# Patient Record
Sex: Male | Born: 1990 | Race: White | Hispanic: No | Marital: Married | Smoking: Never smoker
Health system: Southern US, Community
[De-identification: ages and names within clinical notes are randomized; demographics above are authoritative.]

## PROBLEM LIST (undated history)

## (undated) DIAGNOSIS — U071 COVID-19: Secondary | ICD-10-CM

## (undated) DIAGNOSIS — N200 Calculus of kidney: Secondary | ICD-10-CM

## (undated) HISTORY — DX: Calculus of kidney: N20.0

## (undated) HISTORY — DX: COVID-19: U07.1

---

## 2017-10-26 ENCOUNTER — Ambulatory Visit: Payer: Self-pay

## 2017-10-26 ENCOUNTER — Ambulatory Visit: Payer: Self-pay | Admitting: Sports Medicine

## 2017-10-26 ENCOUNTER — Encounter: Payer: Self-pay | Admitting: Sports Medicine

## 2017-10-26 ENCOUNTER — Ambulatory Visit (INDEPENDENT_AMBULATORY_CARE_PROVIDER_SITE_OTHER): Payer: BLUE CROSS/BLUE SHIELD | Admitting: Sports Medicine

## 2017-10-26 VITALS — BP 120/84 | HR 84 | Ht 71.0 in | Wt 189.0 lb

## 2017-10-26 DIAGNOSIS — G8929 Other chronic pain: Secondary | ICD-10-CM | POA: Diagnosis not present

## 2017-10-26 DIAGNOSIS — M25561 Pain in right knee: Secondary | ICD-10-CM

## 2017-10-26 MED ORDER — IBUPROFEN-FAMOTIDINE 800-26.6 MG PO TABS: 1.0000 | ORAL_TABLET | Freq: Three times a day (TID) | ORAL | 2 refills | Status: DC | PRN

## 2017-10-26 MED ORDER — IBUPROFEN-FAMOTIDINE 800-26.6 MG PO TABS: 1.0000 | ORAL_TABLET | Freq: Three times a day (TID) | ORAL | 0 refills | Status: DC | PRN

## 2017-10-26 NOTE — Progress Notes (Signed)
Veverly FellsMichael D. Delorise Shinerigby, DO   Sports Medicine [facility] [phone]  Daniel Davila - 26 y.o. male MRN [id number]  Date of birth: [birth year]  Visit Date: 10/26/2017  PCP: Patient, No Pcp Per   Referred by: No ref. provider found   Scribe for today's visit: Christoper FabianMolly Weber, ATC    SUBJECTIVE:  Daniel CarinaJess Davila is here for New Patient (Initial Visit) (R knee pain) .   His R post knee pain symptoms INITIALLY: Began a few years ago and has gotten worse over the past 2-3 months.  He notes that he first started having pain [facility] [place] Described as 3/10 pain at rest and a sharp 6-7/10 at it's worst, radiating to both the R thigh and R lower leg Worsened with driving Improved with ice Additional associated symptoms include: no N/T in the R LE.    At this time symptoms are worsening compared to onset. He has tried ice which gives limited relief.   ROS Denies night time disturbances. Denies fevers, chills, or night sweats. Denies unexplained weight loss. Denies personal history of cancer. Denies changes in bowel or bladder habits. Denies recent unreported falls. Denies new or worsening dyspnea or wheezing. Denies headaches or dizziness.  Denies numbness, tingling or weakness  In the extremities.  Denies dizziness or presyncopal episodes Denies lower extremity edema     HISTORY & PERTINENT PRIOR DATA:  Prior History reviewed and updated per electronic medical record.  Significant history, findings, studies and interim changes include:  reports that  has never smoked. he has never used smokeless tobacco. No results for input(s): HGBA1C, LABURIC, CREATINE in the last 8760 hours. No specialty comments available. No problems updated.   OBJECTIVE:  VS:  HT:5\' 11"  (180.3 cm)   WT:189 lb (85.7 kg)  BMI:26.37    BP:120/84  HR:84bpm  TEMP: ( )  RESP:97 %   PHYSICAL EXAM: Constitutional: WDWN, Non-toxic  appearing. Psychiatric: Alert & appropriately interactive. Not depressed or anxious appearing. Respiratory: No increased work of breathing. Trachea Midline Eyes: Pupils are equal. EOM intact without nystagmus. No scleral icterus Cardiovascular:  Peripheral Pulses: peripheral pulses symmetrical No clubbing or cyanosis appreciated Capillary Refill is normal, less than 2 seconds No signficant generalized edema/anasarca Sensory Exam: intact to light touch   Right Knee Exam: No significant rashes/lesions/ulcerations overlying the examined area No overlying erythema/ecchymosis. Alignment & Contours: normal Gait: normal Effusion: None   Generalized Synovitis: no  Tenderness to palpation over: medial joint line, otherwise No focal TTP  ROM: No significant limitations Strength: No significant weakness, extensor mechanism intact Hip Abductor Strength: 4/5 Recruitment pattern: TFL predominant Ligamentous testing: Small amount of pain with valgus stressing Stable to varus/valgus strain Stable to anterior/posterior drawer Solid Lachman's Mcmurray's: Negative Thessaly: Negative No significant Baker's cyst  No additional findings.   ASSESSMENT & PLAN:   1. Chronic pain of right knee    PLAN: Degenerative meniscal changes appreciated on MSK ultrasound. Therapeutic exercises working on quad strengthening and hip strengthening emphasized with the patient today.  Avoidance of exacerbating activities reviewed.  Follow-up with any lack of improvement.  No problem-specific Assessment & Plan notes found for this encounter.   ++++++++++++++++++++++++++++++++++++++++++++ Orders & Meds: Orders Placed This Encounter  Procedures  . US LIMITED JOINT SPACE STRUCTURES LOW RIGHT(NO LINKED CHARGES)    Meds ordered this encounter  Medications  . Ibuprofen-Famotidine (DUEXIS) 800-26.6 MG TABS    Sig: Take 1 tablet by mouth 3 (  three) times daily as needed. 1 tab po tid X 14 days then 1 tab po tid as  needed    Dispense:  90 tablet    Refill:  2    Home Phone      [phone] Mobile          [phone]   . Ibuprofen-Famotidine (DUEXIS) 800-26.6 MG TABS    Sig: Take 1 tablet by mouth 3 (three) times daily as needed.    Dispense:  9 tablet    Refill:  0    ++++++++++++++++++++++++++++++++++++++++++++ Follow-up: Return if symptoms worsen or fail to improve.   Pertinent documentation may be included in additional procedure notes, imaging studies, problem based documentation and patient instructions. Please see these sections of the encounter for additional information regarding this visit. CMA/ATC served as Neurosurgeonscribe during this visit. History, Physical, and Plan performed by medical provider. Documentation and orders reviewed and attested to.      Andrena MewsMichael D Karmela Bram, DO    [facility] Physician

## 2017-11-06 ENCOUNTER — Encounter: Payer: Self-pay | Admitting: Sports Medicine

## 2017-11-06 NOTE — Procedures (Signed)
LIMITED MSK ULTRASOUND OF right knee Images were obtained and interpreted by myself, Gaspar BiddingMichael Rigby, DO  Images have been saved and stored to PACS system. Images obtained on: GE S7 Ultrasound machine  FINDINGS:  Patella & Patellar Tendon: Normal Quad & Quad Tendon: Normal Suprapatellar Pouch: Normal, no effusion Medial Joint Line: Degenerative bulging medial meniscus with no overt tearing although incompletely visualized, small amount of fluid consistent with a small para meniscal cyst tracking distally but this is mild. Lateral Joint Line: Normal lateral meniscus Trochlea: Normal appearing cartilage Posterior knee: No appreciable Baker's cyst  IMPRESSION:  1. Degenerative medial meniscus but no overt tearing, slight extrusion 2. Likely small amount of patellofemoral pain syndrome 3. Possible remote MCL injury

## 2019-07-31 ENCOUNTER — Other Ambulatory Visit: Payer: Self-pay

## 2019-07-31 DIAGNOSIS — Z20822 Contact with and (suspected) exposure to covid-19: Secondary | ICD-10-CM

## 2019-08-02 LAB — NOVEL CORONAVIRUS, NAA: SARS-CoV-2, NAA: NOT DETECTED

## 2019-11-24 ENCOUNTER — Encounter (HOSPITAL_COMMUNITY): Payer: Self-pay

## 2019-11-24 ENCOUNTER — Emergency Department (HOSPITAL_COMMUNITY)
Admission: EM | Admit: 2019-11-24 | Discharge: 2019-11-24 | Disposition: A | Discharge status: Home / Self Care | Payer: 59 | Attending: Emergency Medicine | Admitting: Emergency Medicine

## 2019-11-24 ENCOUNTER — Other Ambulatory Visit: Payer: Self-pay

## 2019-11-24 ENCOUNTER — Emergency Department (HOSPITAL_COMMUNITY): Payer: 59

## 2019-11-24 DIAGNOSIS — R109 Unspecified abdominal pain: Secondary | ICD-10-CM

## 2019-11-24 DIAGNOSIS — N201 Calculus of ureter: Secondary | ICD-10-CM | POA: Diagnosis not present

## 2019-11-24 LAB — URINALYSIS, ROUTINE W REFLEX MICROSCOPIC
Bilirubin Urine: NEGATIVE
Glucose, UA: NEGATIVE mg/dL
Ketones, ur: NEGATIVE mg/dL
Leukocytes,Ua: NEGATIVE
Nitrite: NEGATIVE
Protein, ur: 100 mg/dL — AB
RBC / HPF: >50 RBC/hpf — ABNORMAL HIGH (ref 0–5)
Specific Gravity, Urine: 1.033 — ABNORMAL HIGH (ref 1.005–1.030)
pH: 6 (ref 5.0–8.0)

## 2019-11-24 MED ORDER — HYDROCODONE-ACETAMINOPHEN 5-325 MG PO TABS
1.0000 | ORAL_TABLET | Freq: Once | ORAL | Status: AC
  Administered 2019-11-24: 1 via ORAL
  Filled 2019-11-24: qty 1

## 2019-11-24 MED ORDER — ONDANSETRON HCL 4 MG/2ML IJ SOLN
4.0000 mg | Freq: Once | INTRAMUSCULAR | Status: AC
  Administered 2019-11-24: 4 mg via INTRAVENOUS
  Filled 2019-11-24: qty 2

## 2019-11-24 MED ORDER — HYDROMORPHONE HCL 1 MG/ML IJ SOLN
0.5000 mg | Freq: Once | INTRAMUSCULAR | Status: AC
  Administered 2019-11-24: 0.5 mg via INTRAVENOUS
  Filled 2019-11-24: qty 1

## 2019-11-24 MED ORDER — ONDANSETRON 4 MG PO TBDP: 4.0000 mg | ORAL_TABLET | Freq: Three times a day (TID) | ORAL | 0 refills | Status: DC | PRN

## 2019-11-24 MED ORDER — HYDROCODONE-ACETAMINOPHEN 5-325 MG PO TABS: 1.0000 | ORAL_TABLET | ORAL | 0 refills | Status: DC | PRN

## 2019-11-24 MED ORDER — HYDROMORPHONE HCL 1 MG/ML IJ SOLN
1.0000 mg | Freq: Once | INTRAMUSCULAR | Status: AC
  Administered 2019-11-24: 1 mg via INTRAVENOUS
  Filled 2019-11-24: qty 1

## 2019-11-24 MED ORDER — IBUPROFEN 800 MG PO TABS: 800.0000 mg | ORAL_TABLET | Freq: Three times a day (TID) | ORAL | 0 refills | Status: DC

## 2019-11-24 NOTE — Discharge Instructions (Addendum)
Please go to your urology appointment as scheduled on 11/27/2019.  I encourage you to get established with a primary care provider as soon as possible for ongoing evaluation management of your health and wellbeing.  Please take your medications as prescribed.  Please take the ibuprofen for your discomfort and the Vicodin only for breakthrough pain.  Return to the ED or seek medical attention immediately should you develop any uncontrolled nausea and vomiting, pain uncontrolled with medication, fevers or chills, inability to urinate, or any other new or worsening symptoms.  You were given narcotic and or sedative medications while in the emergency department. Do not drive. Do not use machinery or power tools. Do not sign legal documents. Do not drink alcohol. Do not take sleeping pills. Do not supervise children by yourself. Do not participate in activities that require climbing or being in high places.

## 2019-11-24 NOTE — ED Provider Notes (Signed)
[facility] DEPT Provider Note   CSN: [id number] Arrival date & time: 11/24/19  0456     History Chief Complaint  Patient presents with  . Hematuria  . Flank Pain    [name] is a 29 y.o. male.  Patient to ED with onset right flank pain and vomiting tonight. No fever. He reports history of kidney stone x 1 two years ago that did not require intervention. He reports hematuria. No CP, SOB, cough.   The history is provided by the patient. No language interpreter was used.  Hematuria  Flank Pain       History reviewed. No pertinent past medical history.  There are no problems to display for this patient.   History reviewed. No pertinent surgical history.     History reviewed. No pertinent family history.  Social History   Tobacco Use  . Smoking status: Never Smoker  . Smokeless tobacco: Never Used  Substance Use Topics  . Alcohol use: Not on file  . Drug use: Not on file    Home Medications Prior to Admission medications   Medication Sig Start Date End Date Taking? Authorizing Provider  Ibuprofen-Famotidine (DUEXIS) 800-26.6 MG TABS Take 1 tablet by mouth 3 (three) times daily as needed. 1 tab po tid X 14 days then 1 tab po tid as needed 10/26/17   Gerda Diss, DO  Ibuprofen-Famotidine (DUEXIS) 800-26.6 MG TABS Take 1 tablet by mouth 3 (three) times daily as needed. 10/26/17   Gerda Diss, DO    Allergies    Patient has no known allergies.  Review of Systems   Review of Systems  Constitutional: Negative for chills and fever.  Respiratory: Negative.   Cardiovascular: Negative.   Gastrointestinal: Positive for nausea and vomiting.  Genitourinary: Positive for flank pain and hematuria.  Skin: Negative.   Neurological: Negative.     Physical Exam Updated Vital Signs BP (!) 151/79 (BP Location: Left Arm)   Pulse 75   Temp 98.6 F (37 C) (Oral)   Resp 18   Ht 6' (1.829 m)   Wt 88.5 kg   SpO2 96%   BMI  26.45 kg/m   Physical Exam Vitals and nursing note reviewed.  Constitutional:      Appearance: He is well-developed.     Comments: Uncomfortable appearing, actively vomiting.  Cardiovascular:     Rate and Rhythm: Normal rate.  Pulmonary:     Effort: Pulmonary effort is normal.  Abdominal:     Tenderness: There is abdominal tenderness (Right flank to RLQ abdominal tenderness).  Musculoskeletal:        General: Normal range of motion.     Cervical back: Normal range of motion.  Skin:    General: Skin is warm and dry.  Neurological:     Mental Status: He is alert and oriented to person, place, and time.     ED Results / Procedures / Treatments   Labs (all labs ordered are listed, but only abnormal results are displayed) Labs Reviewed  URINALYSIS, ROUTINE W REFLEX MICROSCOPIC    EKG None  Radiology No results found.  Procedures Procedures (including critical care time)  Medications Ordered in ED Medications  HYDROmorphone (DILAUDID) injection 1 mg (has no administration in time range)  ondansetron (ZOFRAN) injection 4 mg (has no administration in time range)    ED Course  I have reviewed the triage vital signs and the nursing notes.  Pertinent labs & imaging results that were available during  my care of the patient were reviewed by me and considered in my medical decision making (see chart for details).    MDM Rules/Calculators/A&P                      Patient with h/o kidney stone 2 years ago presents with right flank pain and hematuria starting tonight. No fever.   Likely recurrent stone in right ureter. UA and CT renal pending. Re-eval after 1 mg dilaudid and Zofran: his nausea is resolved, pain is present but better. Dilaudid 0.5 mg ordered.   Patient care signed out to Evelena Leyden, PA-C, pending CT and UA results.  Final Clinical Impression(s) / ED Diagnoses Final diagnoses:  None   1. Renal colic  Rx / DC Orders ED Discharge Orders    None        Elpidio Anis, PA-C 11/24/19 [id number]    Devoria Albe, MD 11/24/19 [phone]

## 2019-11-24 NOTE — ED Triage Notes (Signed)
Pt reports flank pain, hematuria and N/V. Vomiting in triage.

## 2019-11-24 NOTE — ED Provider Notes (Signed)
Received patient as a handoff from Elpidio Anis, PA-C at shift change.  HPI as obtained by handoff provider:  [name] is a 29 y.o. male.  Patient to ED with onset right flank pain and vomiting tonight. No fever. He reports history of kidney stone x 1 two years ago that did not require intervention. He reports hematuria. No CP, SOB, cough.    Physical Exam  BP 128/78 (BP Location: Left Arm)   Pulse (!) 55   Temp 98.6 F (37 C) (Oral)   Resp 16   Ht 6' (1.829 m)   Wt 88.5 kg   SpO2 100%   BMI 26.45 kg/m   Physical Exam Vitals and nursing note reviewed. Exam conducted with a chaperone present.  Constitutional:      Appearance: Normal appearance.     Comments: Mild discomfort.  HENT:     Head: Normocephalic and atraumatic.  Eyes:     General: No scleral icterus.    Conjunctiva/sclera: Conjunctivae normal.  Cardiovascular:     Rate and Rhythm: Normal rate and regular rhythm.  Pulmonary:     Effort: Pulmonary effort is normal.     Breath sounds: Normal breath sounds.  Abdominal:     Comments: Right-sided flank and RLQ TTP.  No overlying skin changes.  No TTP elsewhere.  No guarding.  No masses appreciated.  Musculoskeletal:     Cervical back: Normal range of motion. No rigidity.  Skin:    General: Skin is dry.     Capillary Refill: Capillary refill takes less than 2 seconds.  Neurological:     Mental Status: He is alert and oriented to person, place, and time.     GCS: GCS eye subscore is 4. GCS verbal subscore is 5. GCS motor subscore is 6.  Psychiatric:        Mood and Affect: Mood normal.        Behavior: Behavior normal.        Thought Content: Thought content normal.      ED Course/Procedures     Procedures Results for orders placed or performed during the hospital encounter of 11/24/19  Urinalysis, Routine w reflex microscopic- may I&O cath if menses  Result Value Ref Range   Color, Urine AMBER (A) YELLOW   APPearance CLOUDY (A) CLEAR   Specific  Gravity, Urine 1.033 (H) 1.005 - 1.030   pH 6.0 5.0 - 8.0   Glucose, UA NEGATIVE NEGATIVE mg/dL   Hgb urine dipstick LARGE (A) NEGATIVE   Bilirubin Urine NEGATIVE NEGATIVE   Ketones, ur NEGATIVE NEGATIVE mg/dL   Protein, ur 220 (A) NEGATIVE mg/dL   Nitrite NEGATIVE NEGATIVE   Leukocytes,Ua NEGATIVE NEGATIVE   RBC / HPF >50 (H) 0 - 5 RBC/hpf   Bacteria, UA RARE (A) NONE SEEN   Mucus PRESENT    Hyaline Casts, UA PRESENT    Ca Oxalate Crys, UA PRESENT    CT Renal Stone Study  Result Date: 11/24/2019 CLINICAL DATA:  Right flank pain into right groin. Gross hematuria. Hx of kidney stones. Flank pain, kidney stone suspected EXAM: CT ABDOMEN AND PELVIS WITHOUT CONTRAST TECHNIQUE: Multidetector CT imaging of the abdomen and pelvis was performed following the standard protocol without IV contrast. COMPARISON:  None. FINDINGS: Lower chest: Lung bases are clear. Small 3 mm nodule in the RIGHT lower lobe (image 7/6) Hepatobiliary: No focal hepatic lesion on noncontrast exam. Normal gallbladder. Pancreas: Pancreas is normal. No ductal dilatation. No pancreatic inflammation. Spleen: Normal spleen Adrenals/urinary tract:  Adrenal glands normal. Perinephric stranding about the RIGHT kidney consistent with renal edema. There is periureteral stranding about the proximal RIGHT ureter. There is obstructing calculus in the mid RIGHT ureter measuring 4 mm on image 52/2. This mid RIGHT ureteral calculus is at the L4 vertebral body level. No additional calculi within LEFT or RIGHT kidney. No bladder calculi. Stomach/Bowel: Stomach, small bowel, appendix, and cecum are normal. The colon and rectosigmoid colon are normal. Vascular/Lymphatic: Abdominal aorta is normal caliber. No periportal or retroperitoneal adenopathy. No pelvic adenopathy. Reproductive: Prostate normal. Other: No free fluid. Musculoskeletal: No aggressive osseous lesion IMPRESSION: 1. Obstructing calculus in the mid RIGHT ureter. 2. No nephrolithiasis.  Electronically Signed   By: Suzy Bouchard M.D.   On: 11/24/2019 07:31     MDM   Patient is resting comfortably my exam.  He is recently moved here from [place], [place] and has not yet established with a primary care provider.  He reports that he has a family history of kidney stones and that this is his second one.  He reports that his significant right-sided flank and RLQ pain began around 4:30 AM and has been persistent, however it is well controlled with his Dilaudid and Zofran.  Patient denies any fevers or chills and his UA demonstrated no evidence of infection.  Given that the obstructing stone is only 4 mm, do not feel as though Flomax is necessary.  We will discharge home with Zofran ODT for his nausea symptoms as well as ibuprofen 800 mg 3 times daily.  He will also be prescribed a short course of Vicodin for breakthrough pain.  Patient reports that he already has an appointment with urology scheduled for Wednesday, 11/27/2019 given his frank hematuria.  His wife will pick up from the ER and he will continue his pain control at home.  Return precautions discussed. All of the evaluation and work-up results were discussed with the patient and any family at bedside. They were provided opportunity to ask any additional questions and have none at this time. They have expressed understanding of verbal discharge instructions as well as return precautions and are agreeable to the plan.         Corena Herter, PA-C 11/24/19 [id number]    Veryl Speak, MD 11/24/19 1446

## 2020-12-09 IMAGING — CT CT RENAL STONE PROTOCOL
2 of 4 series · 17 of 46 positions shown, 19 images · non-contrast
Comparison: None.

CLINICAL DATA: Right flank pain into right groin. Gross hematuria.
Hx of kidney stones. Flank pain, kidney stone suspected

EXAM:
CT ABDOMEN AND PELVIS WITHOUT CONTRAST
TECHNIQUE: Multidetector CT imaging of the abdomen and pelvis was performed
following the standard protocol without IV contrast.

[Series 2: axial st · axial · 0.77mm/px · z∈[-497,-47]mm · 14 of 103 slices shown, 16 images]
[im 7/103  soft-tissue]
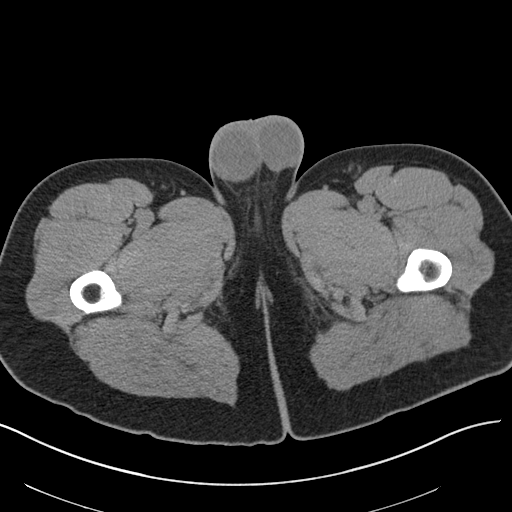
[im 7/103  bone]
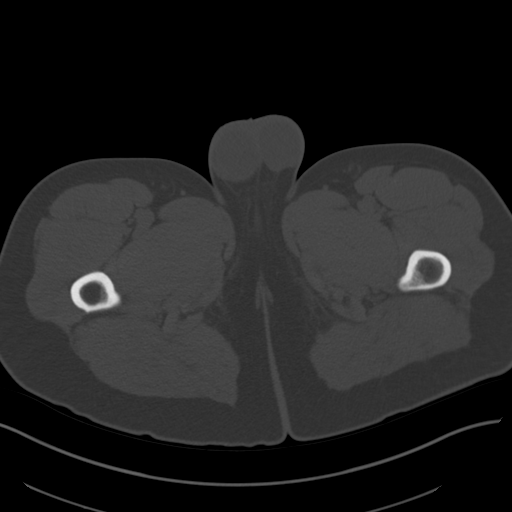
[im 13/103  soft-tissue]
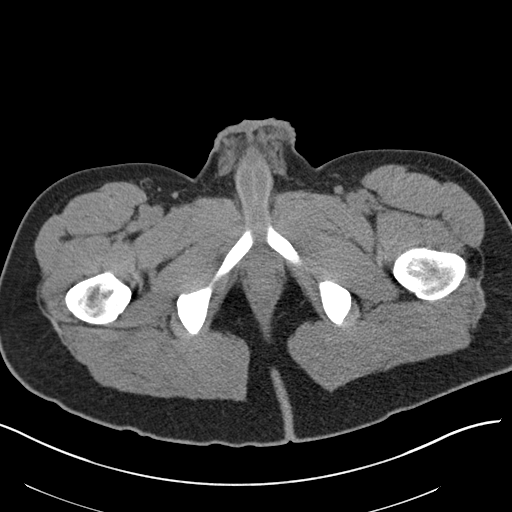
[im 19/103  soft-tissue]
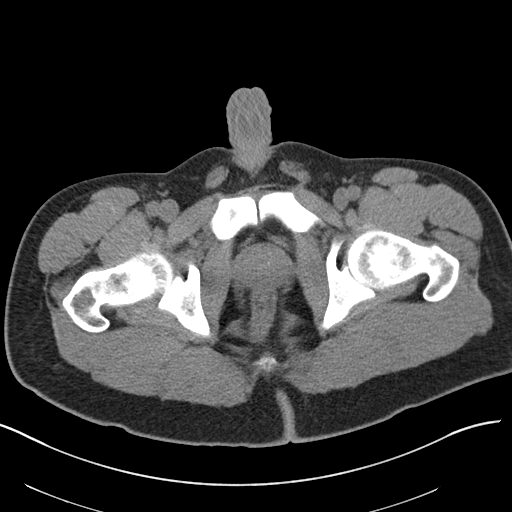
[im 31/103  soft-tissue]
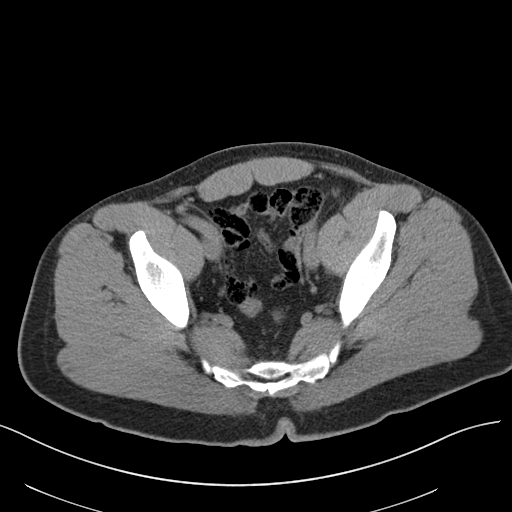
[im 37/103  soft-tissue]
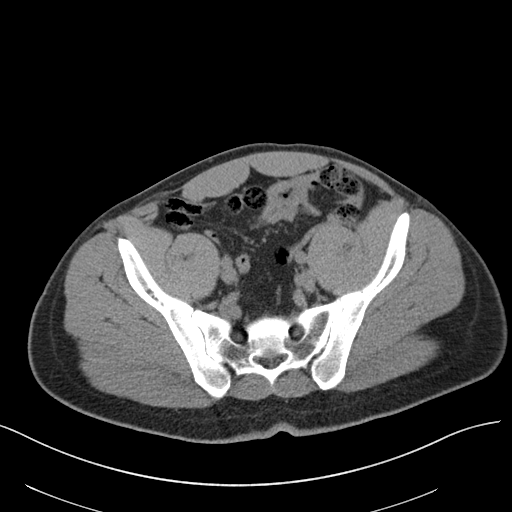
[im 43/103  soft-tissue]
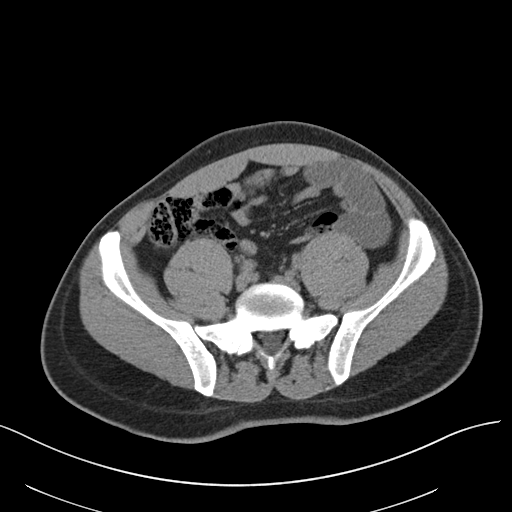
[im 49/103  soft-tissue]
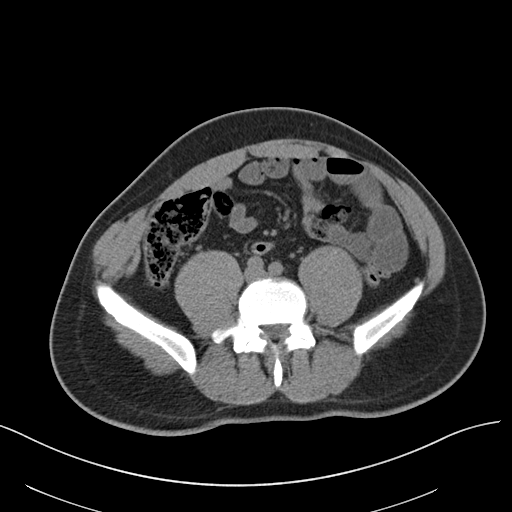
[im 55/103  soft-tissue]
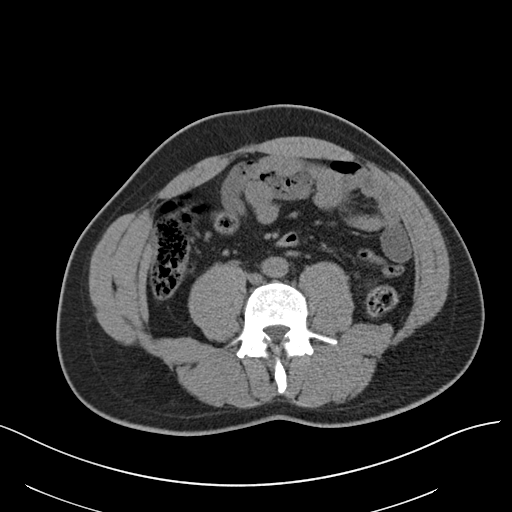
[im 61/103  soft-tissue]
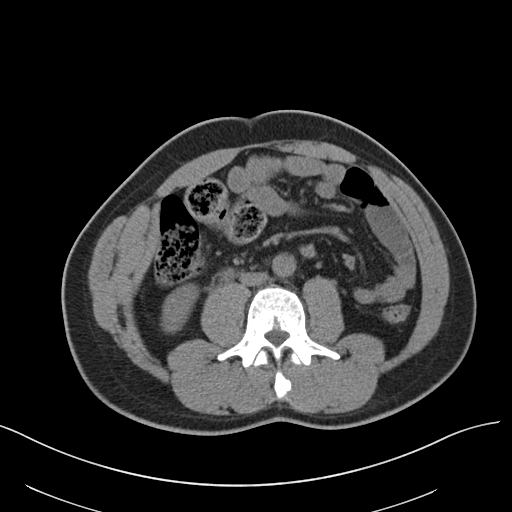
[im 61/103  bone]
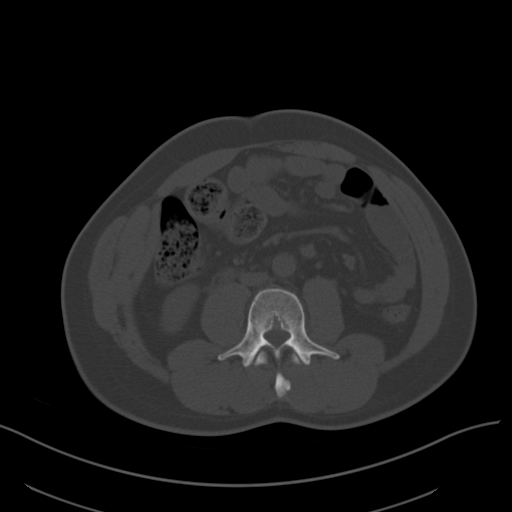
[im 67/103  soft-tissue]
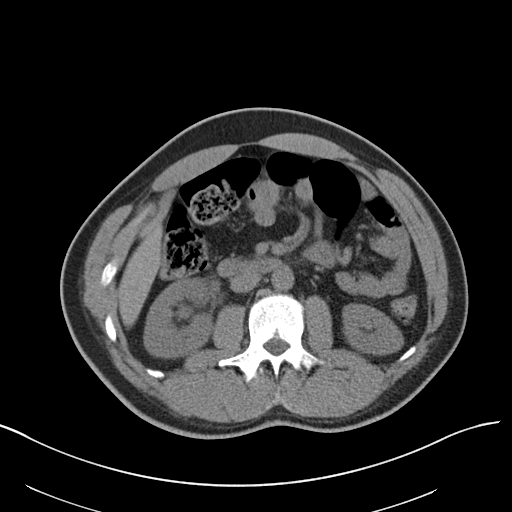
[im 79/103  soft-tissue]
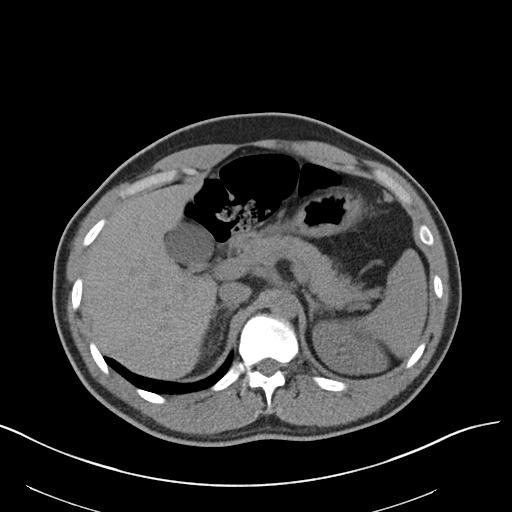
[im 85/103  soft-tissue]
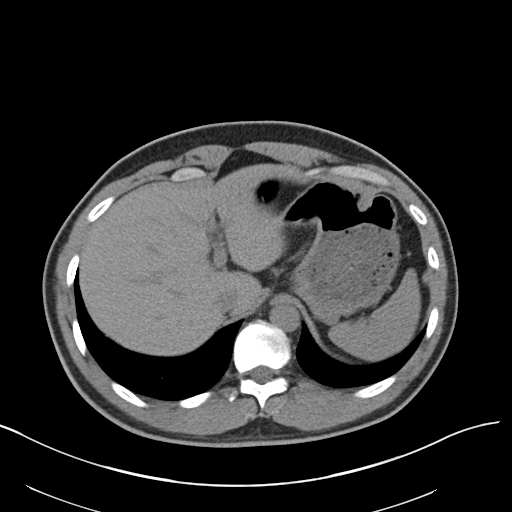
[im 91/103  soft-tissue]
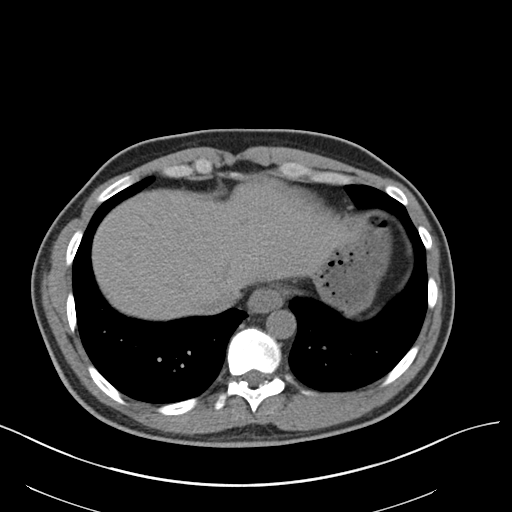
[im 97/103  soft-tissue]
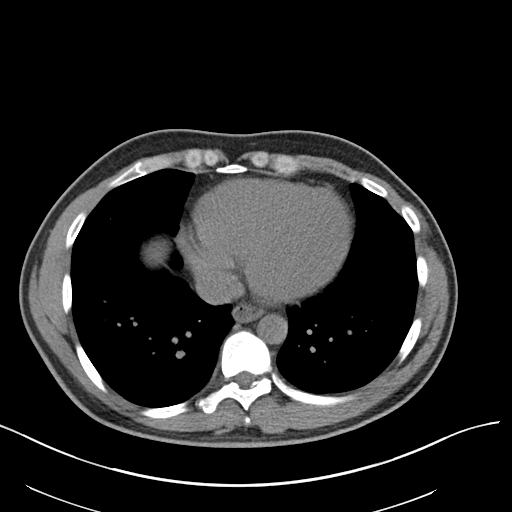

[Series 4: coronal · coronal · 0.94mm/px · 3 of 129 slices shown]
[im 43/129  soft-tissue]
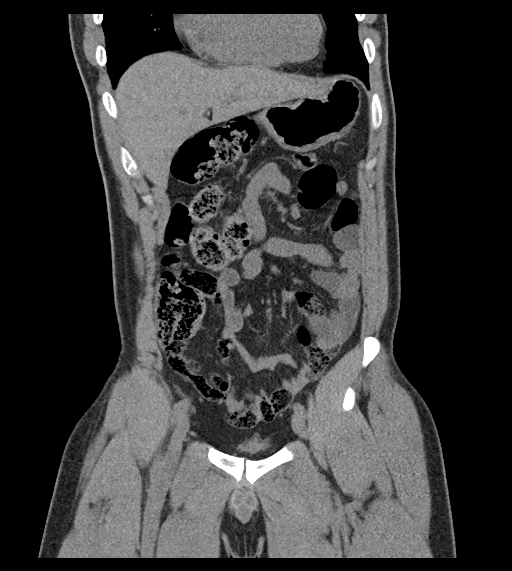
[im 57/129  soft-tissue]
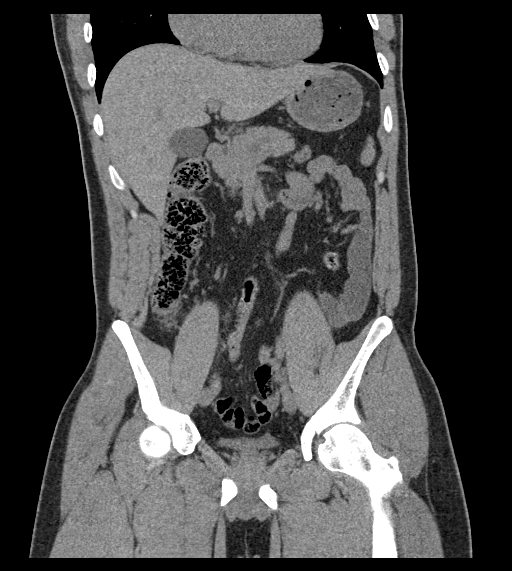
[im 72/129  soft-tissue]
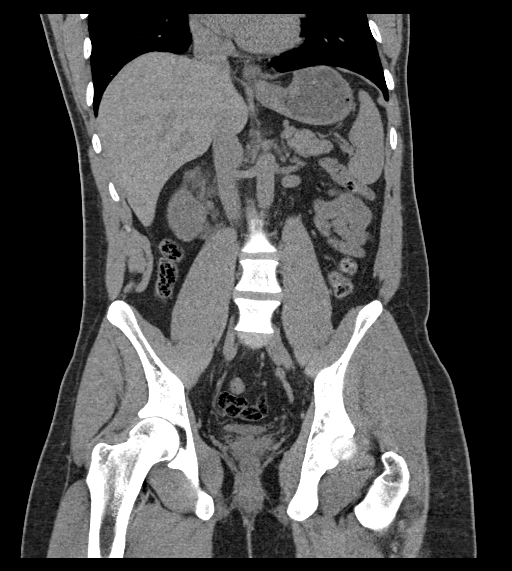

[17 of 46 positions shown; findings below may reference images not displayed]

FINDINGS: Lower chest: Lung bases are clear. Small 3 mm nodule in the RIGHT
lower lobe (image [DATE])

Hepatobiliary: No focal hepatic lesion on noncontrast exam. Normal
gallbladder.

Pancreas: Pancreas is normal. No ductal dilatation. No pancreatic
inflammation.

Spleen: Normal spleen

Adrenals/urinary tract: Adrenal glands normal.

Perinephric stranding about the RIGHT kidney consistent with renal
edema. There is periureteral stranding about the proximal RIGHT
ureter. There is obstructing calculus in the mid RIGHT ureter
measuring 4 mm on image 52/2. This mid RIGHT ureteral calculus is at
the L4 vertebral body level.

No additional calculi within LEFT or RIGHT kidney. No bladder
calculi.

Stomach/Bowel: Stomach, small bowel, appendix, and cecum are normal.
The colon and rectosigmoid colon are normal.

Vascular/Lymphatic: Abdominal aorta is normal caliber. No periportal
or retroperitoneal adenopathy. No pelvic adenopathy.

Reproductive: Prostate normal.

Other: No free fluid.

Musculoskeletal: No aggressive osseous lesion
IMPRESSION: 1. Obstructing calculus in the mid RIGHT ureter.
2. No nephrolithiasis.

## 2024-08-28 ENCOUNTER — Ambulatory Visit: Attending: Cardiology | Admitting: Cardiovascular Disease

## 2024-08-28 ENCOUNTER — Encounter: Payer: Self-pay | Admitting: Cardiovascular Disease

## 2024-08-28 VITALS — BP 106/66 | HR 58 | Ht 71.0 in | Wt 206.3 lb

## 2024-08-28 DIAGNOSIS — R001 Bradycardia, unspecified: Secondary | ICD-10-CM | POA: Diagnosis not present

## 2024-08-28 NOTE — Progress Notes (Signed)
 08/28/2024 Daniel Davila   1990-12-15  [id number]  Primary Physician Marvene Prentice SAUNDERS, FNP Primary Cardiologist: Dorn JINNY Lesches MD [facility] [place]  HPI:  Daniel Davila is a 33 y.o. thin and fit appearing married Caucasian male father of 2 young children who works as a Scientific laboratory technician at [employer].  He is accompanied by his wife Daniel Davila today.  He is referred by Prentice brake FNP for bradycardia.  He has no cardiac risk factors.  He has never had a heart attack or stroke.  He denies chest pain or shortness of breath.  He admits to not being very active although he walks daily with his wife.  He does suffer from some anxiety.  Apparently has had bradycardia for years but it was recently brought to his attention this year.  He did have a 11-day Zio patch placed by his primary care provider 03/29/2024 revealing an average heart rate of 64, range of 28-1 91 with a 3.7-second pause in the early a.m. hours.  He is completely asymptomatic.  He is not on any negative chronotropic medications and his TSH is normal.   Current Meds  Medication Sig   [DISCONTINUED] HYDROcodone -acetaminophen  (NORCO/VICODIN) 5-325 MG tablet Take 1 tablet by mouth every 4 (four) hours as needed for severe pain.   [DISCONTINUED] ibuprofen  (ADVIL ) 800 MG tablet Take 1 tablet (800 mg total) by mouth 3 (three) times daily.   [DISCONTINUED] Ibuprofen -Famotidine  (DUEXIS ) 800-26.6 MG TABS Take 1 tablet by mouth 3 (three) times daily as needed. 1 tab po tid X 14 days then 1 tab po tid as needed   [DISCONTINUED] Ibuprofen -Famotidine  (DUEXIS ) 800-26.6 MG TABS Take 1 tablet by mouth 3 (three) times daily as needed.   [DISCONTINUED] ondansetron  (ZOFRAN  ODT) 4 MG disintegrating tablet Take 1 tablet (4 mg total) by mouth every 8 (eight) hours as needed for nausea or vomiting.     No Known Allergies  Social History   Socioeconomic History   Marital status: Married    Spouse name: Not on file   Number of children: Not on  file   Years of education: Not on file   Highest education level: Not on file  Occupational History   Not on file  Tobacco Use   Smoking status: Never   Smokeless tobacco: Never  Substance and Sexual Activity   Alcohol use: Not on file   Drug use: Not on file   Sexual activity: Not on file  Other Topics Concern   Not on file  Social History Narrative   Not on file   Social Drivers of Health   Financial Resource Strain: Not on file  Food Insecurity: Not on file  Transportation Needs: Not on file  Physical Activity: Not on file  Stress: Not on file  Social Connections: Not on file  Intimate Partner Violence: Not on file     Review of Systems: General: negative for chills, fever, night sweats or weight changes.  Cardiovascular: negative for chest pain, dyspnea on exertion, edema, orthopnea, palpitations, paroxysmal nocturnal dyspnea or shortness of breath Dermatological: negative for rash Respiratory: negative for cough or wheezing Urologic: negative for hematuria Abdominal: negative for nausea, vomiting, diarrhea, bright red blood per rectum, melena, or hematemesis Neurologic: negative for visual changes, syncope, or dizziness All other systems reviewed and are otherwise negative except as noted above.    Blood pressure 106/66, pulse (!) 58, height 5' 11 (1.803 m), weight 206 lb 4.8 oz (93.6 kg), SpO2 98%.  General appearance: alert and no distress Neck: no adenopathy, no carotid bruit, no JVD, supple, symmetrical, trachea midline, and thyroid not enlarged, symmetric, no tenderness/mass/nodules Lungs: clear to auscultation bilaterally Heart: regular rate and rhythm, S1, S2 normal, no murmur, click, rub or gallop Extremities: extremities normal, atraumatic, no cyanosis or edema Pulses: 2+ and symmetric Skin: Skin color, texture, turgor normal. No rashes or lesions Neurologic: Grossly normal  EKG EKG Interpretation Date/Time:  Wednesday August 28 2024 09:35:49  EDT Ventricular Rate:  58 PR Interval:  170 QRS Duration:  82 QT Interval:  416 QTC Calculation: 408 R Axis:   50  Text Interpretation: Sinus bradycardia with sinus arrhythmia No previous ECGs available Confirmed by Court Carrier [phone]) on 08/28/2024 9:39:23 AM    ASSESSMENT AND PLAN:   Sinus bradycardia [name] was referred to me by Prentice brake FNP because of asymptomatic sinus bradycardia.  He basically has no medical problems.  He admits to not being very active but he walks on a daily basis.  He is on no negative chronotropic medications.  His TSH was within normal limits.  He did have a 2-week Zio patch performed 03/29/2024 for 11 days revealing an average heart rate of 64, minimum of 28 and max of 191 with a occasional pause of 3.7 seconds in the early a.m. hours.  He did have heart rates 28 around 8:30 in the morning.  He is completely asymptomatic.  He may have increased vagal tone.  This point, no further evaluation is necessary.  Should he become symptomatic he may be a candidate for permanent transvenous pacing.     Carrier DOROTHA Court MD FACP,FACC,FAHA, [facility] 08/28/2024 9:51 AM

## 2024-08-28 NOTE — Patient Instructions (Signed)
 Medication Instructions:  Your physician recommends that you continue on your current medications as directed. Please refer to the Current Medication list given to you today.  *If you need a refill on your cardiac medications before your next appointment, please call your pharmacy*  Testing/Procedures: Your physician has requested that you have an echocardiogram. Echocardiography is a painless test that uses sound waves to create images of your heart. It provides your doctor with information about the size and shape of your heart and how well your heart's chambers and valves are working. This procedure takes approximately one hour. There are no restrictions for this procedure. Please do NOT wear cologne, perfume, aftershave, or lotions (deodorant is allowed). Please arrive 15 minutes prior to your appointment time.  Please note: We ask at that you not bring children with you during ultrasound (echo/ vascular) testing. Due to room size and safety concerns, children are not allowed in the ultrasound rooms during exams. Our front office staff cannot provide observation of children in our lobby area while testing is being conducted. An adult accompanying a patient to their appointment will only be allowed in the ultrasound room at the discretion of the ultrasound technician under special circumstances. We apologize for any inconvenience.   Follow-Up: At [facility], you and your health needs are our priority.  As part of our continuing mission to provide you with exceptional heart care, our providers are all part of one team.  This team includes your primary Cardiologist (physician) and Advanced Practice Providers or APPs (Physician Assistants and Nurse Practitioners) who all work together to provide you with the care you need, when you need it.  Your next appointment:   We will see you on an as needed basis.   Provider:   Dorn Lesches, MD

## 2024-08-28 NOTE — Assessment & Plan Note (Signed)
 Daniel Davila was referred to me by Prentice brake FNP because of asymptomatic sinus bradycardia.  He basically has no medical problems.  He admits to not being very active but he walks on a daily basis.  He is on no negative chronotropic medications.  His TSH was within normal limits.  He did have a 2-week Zio patch performed 03/29/2024 for 11 days revealing an average heart rate of 64, minimum of 28 and max of 191 with a occasional pause of 3.7 seconds in the early a.m. hours.  He did have heart rates 28 around 8:30 in the morning.  He is completely asymptomatic.  He may have increased vagal tone.  This point, no further evaluation is necessary.  Should he become symptomatic he may be a candidate for permanent transvenous pacing.

## 2024-10-09 ENCOUNTER — Ambulatory Visit: Payer: Self-pay | Admitting: Cardiovascular Disease

## 2024-10-09 ENCOUNTER — Ambulatory Visit (HOSPITAL_COMMUNITY)
Admission: RE | Admit: 2024-10-09 | Discharge: 2024-10-09 | Disposition: A | Source: Ambulatory Visit | Attending: Cardiovascular Disease | Admitting: Cardiovascular Disease

## 2024-10-09 DIAGNOSIS — R001 Bradycardia, unspecified: Secondary | ICD-10-CM | POA: Insufficient documentation

## 2024-10-09 LAB — ECHOCARDIOGRAM COMPLETE
Area-P 1/2: 2.67 cm2
S' Lateral: 3.5 cm
